# Patient Record
Sex: Female | Born: 1990 | Race: White | Hispanic: No | Marital: Single
Health system: Southern US, Community
[De-identification: ages and names within clinical notes are randomized; demographics above are authoritative.]

---
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
## 2009-02-11 ENCOUNTER — Ambulatory Visit (HOSPITAL_COMMUNITY): Admission: RE | Admit: 2009-02-11 | Discharge: 2009-02-11 | Payer: Self-pay | Admitting: Obstetrics & Gynecology

## 2009-06-18 ENCOUNTER — Ambulatory Visit (HOSPITAL_COMMUNITY): Admission: RE | Admit: 2009-06-18 | Discharge: 2009-06-18 | Payer: Self-pay | Admitting: Obstetrics & Gynecology

## 2009-07-07 ENCOUNTER — Ambulatory Visit: Payer: Self-pay | Admitting: Advanced Practice Midwife

## 2009-07-07 ENCOUNTER — Inpatient Hospital Stay (HOSPITAL_COMMUNITY): Admission: AD | Admit: 2009-07-07 | Discharge: 2009-07-09 | Payer: Self-pay | Admitting: Obstetrics & Gynecology

## 2009-08-15 ENCOUNTER — Emergency Department (HOSPITAL_COMMUNITY): Admission: EM | Admit: 2009-08-15 | Discharge: 2009-08-15 | Payer: Self-pay | Admitting: Emergency Medicine

## 2010-01-11 IMAGING — CR DG CERVICAL SPINE COMPLETE 4+V
6 series · 6 of 6 positions shown · non-contrast
Comparison: None

CLINICAL DATA: Neck trauma and posterior neck pain.

CERVICAL SPINE - 4+ VIEWS

[view not recorded (1 of 6)]
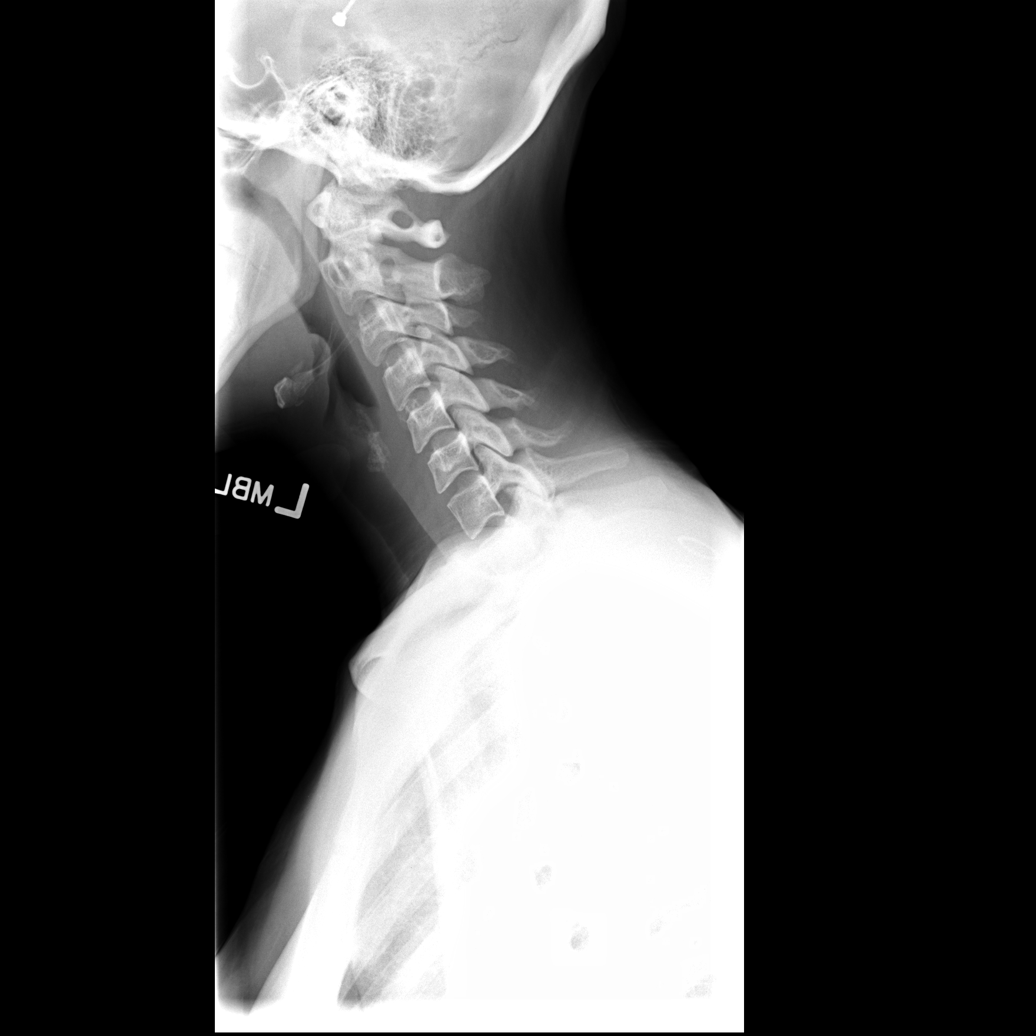

[view not recorded (2 of 6)]
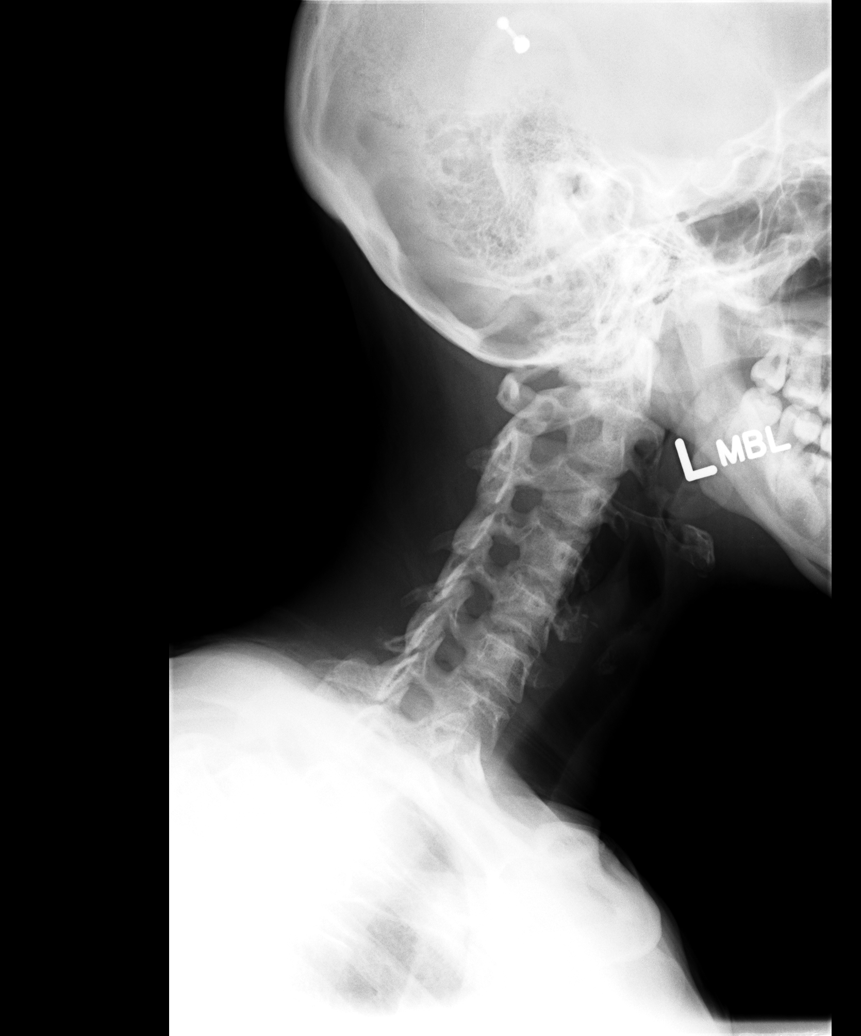

[view not recorded (3 of 6)]
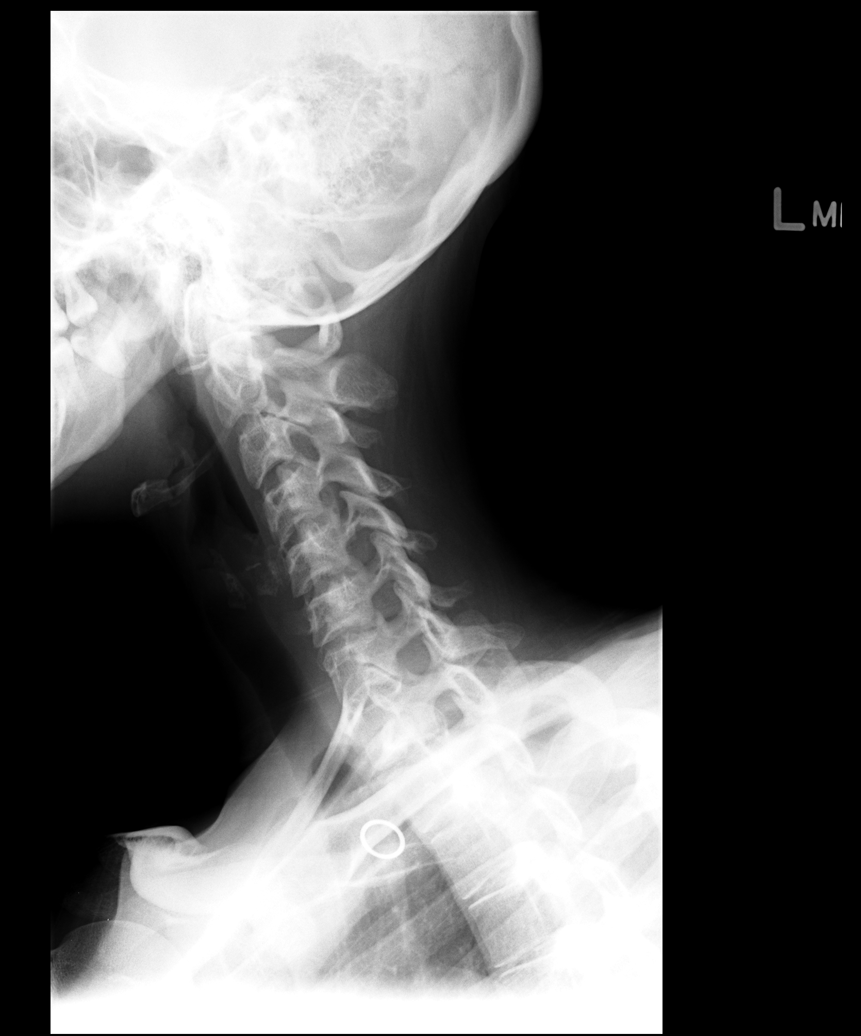

[view not recorded (4 of 6)]
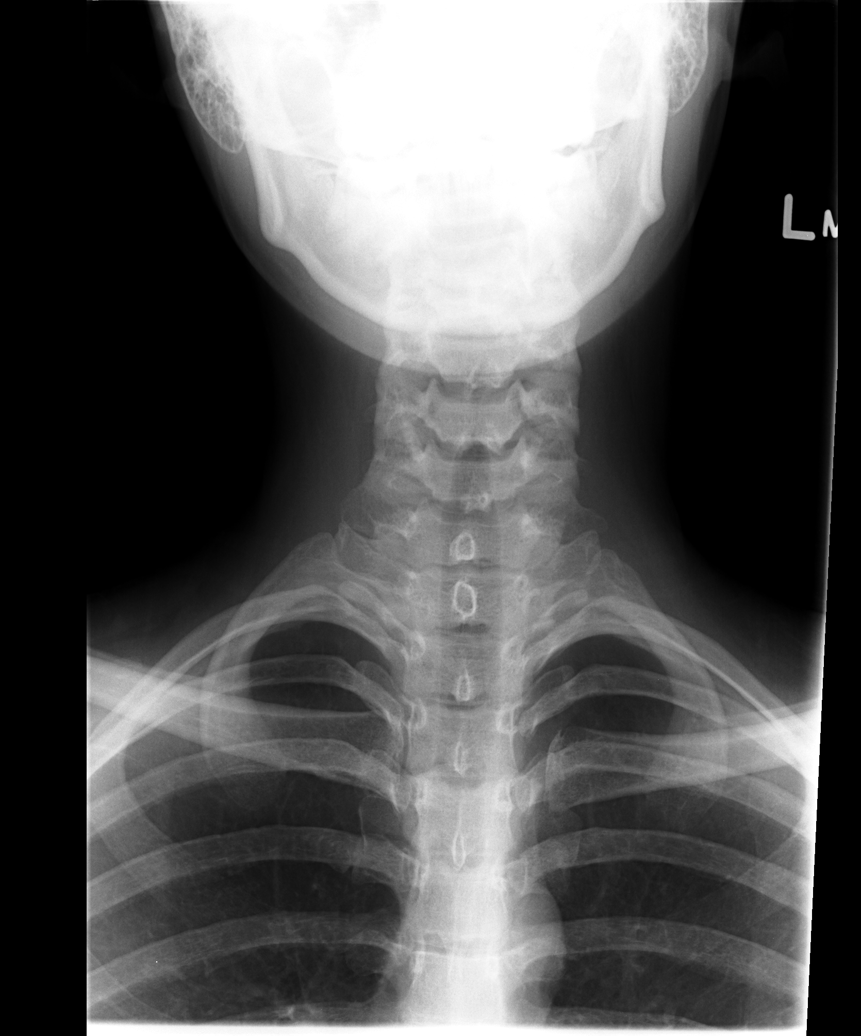

[view not recorded (5 of 6)]
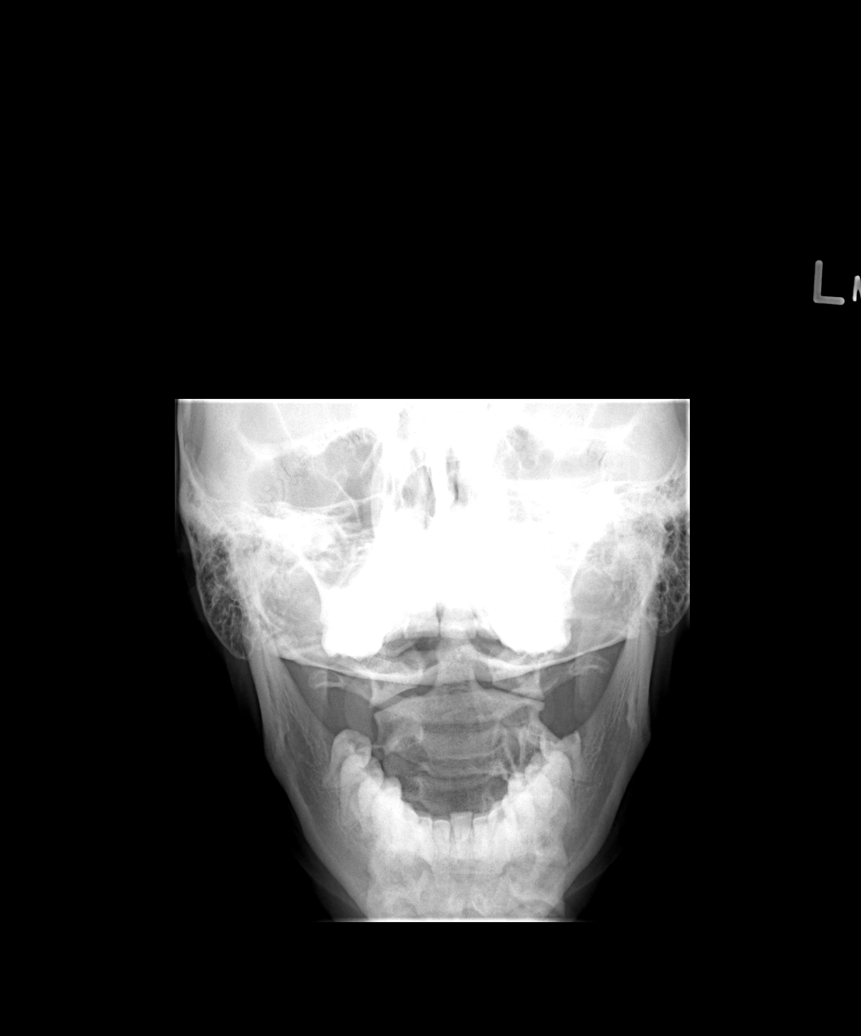

[view not recorded (6 of 6)]
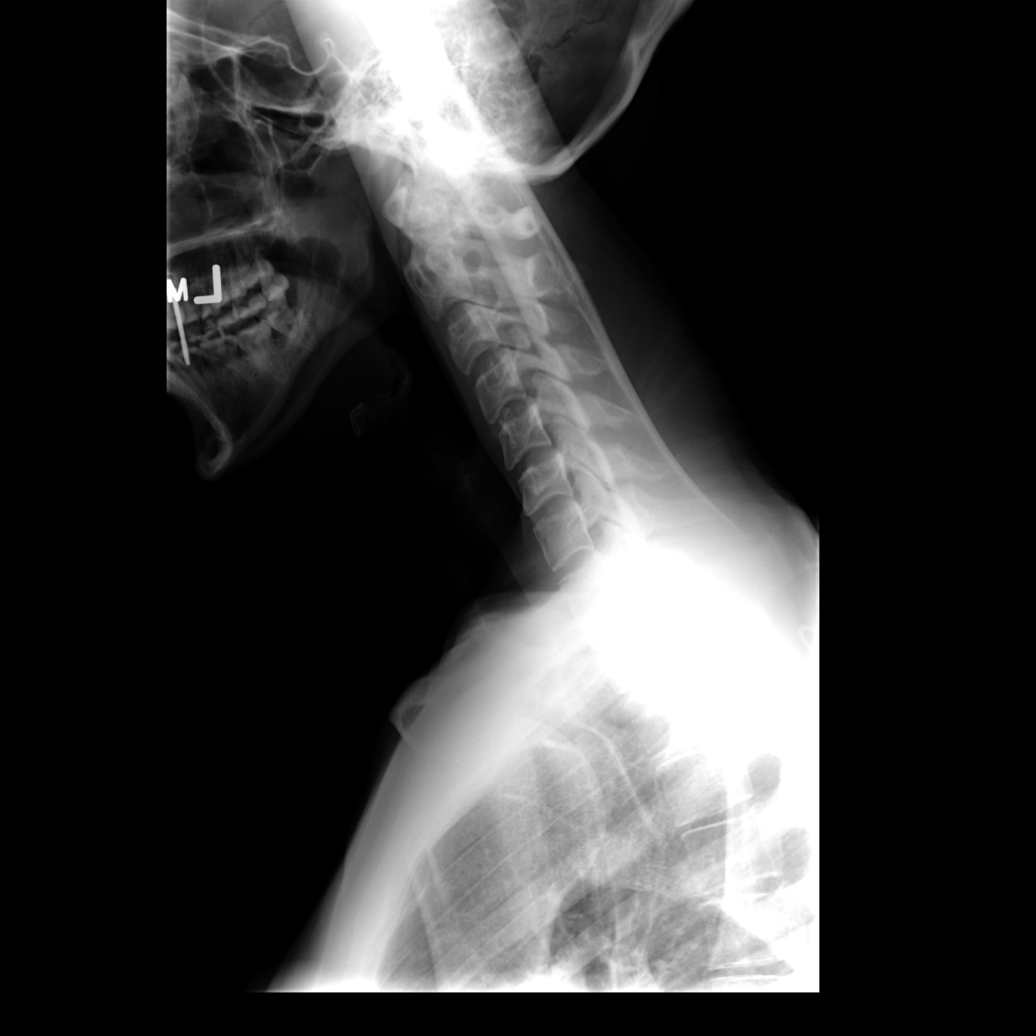

[6 of 6 positions shown; findings below may reference images not displayed]

FINDINGS: There is no evidence of cervical spine fracture or
prevertebral soft tissue swelling.  Alignment is normal.  No other
significant bone abnormalities are identified.
IMPRESSION: Negative cervical spine radiographs.

## 2010-10-04 ENCOUNTER — Emergency Department (HOSPITAL_COMMUNITY)
Admission: EM | Admit: 2010-10-04 | Discharge: 2010-10-04 | Disposition: A | Discharge status: Other Acute Inpatient Hospital | Payer: Self-pay | Source: Home / Self Care | Admitting: Emergency Medicine

## 2010-10-06 LAB — CBC
HCT: 42.2 % (ref 36.0–46.0)
Hemoglobin: 14.5 g/dL (ref 12.0–15.0)
MCH: 30.3 pg (ref 26.0–34.0)
MCHC: 34.4 g/dL (ref 30.0–36.0)
MCV: 88.3 fL (ref 78.0–100.0)
Platelets: 299 10*3/uL (ref 150–400)
RBC: 4.78 MIL/uL (ref 3.87–5.11)
RDW: 13.6 % (ref 11.5–15.5)
WBC: 5.4 10*3/uL (ref 4.0–10.5)

## 2010-10-06 LAB — COMPREHENSIVE METABOLIC PANEL
ALT: 15 U/L (ref 0–35)
AST: 18 U/L (ref 0–37)
Albumin: 4.2 g/dL (ref 3.5–5.2)
Alkaline Phosphatase: 38 U/L — ABNORMAL LOW (ref 39–117)
BUN: 3 mg/dL — ABNORMAL LOW (ref 6–23)
CO2: 28 mEq/L (ref 19–32)
Calcium: 9.6 mg/dL (ref 8.4–10.5)
Chloride: 103 mEq/L (ref 96–112)
Creatinine, Ser: 0.66 mg/dL (ref 0.4–1.2)
GFR calc Af Amer: >60 mL/min (ref >60)
GFR calc non Af Amer: >60 mL/min (ref >60)
Glucose, Bld: 106 mg/dL — ABNORMAL HIGH (ref 70–99)
Potassium: 3.5 mEq/L (ref 3.5–5.1)
Sodium: 140 mEq/L (ref 135–145)
Total Bilirubin: 0.3 mg/dL (ref 0.3–1.2)
Total Protein: 6.9 g/dL (ref 6.0–8.3)

## 2010-10-06 LAB — DIFFERENTIAL
Basophils Absolute: 0 10*3/uL (ref 0.0–0.1)
Basophils Relative: 1 % (ref 0–1)
Eosinophils Absolute: 0.1 10*3/uL (ref 0.0–0.7)
Eosinophils Relative: 1 % (ref 0–5)
Lymphocytes Relative: 32 % (ref 12–46)
Lymphs Abs: 1.7 10*3/uL (ref 0.7–4.0)
Monocytes Absolute: 0.4 10*3/uL (ref 0.1–1.0)
Monocytes Relative: 8 % (ref 3–12)
Neutro Abs: 3.1 10*3/uL (ref 1.7–7.7)
Neutrophils Relative %: 59 % (ref 43–77)

## 2010-10-06 LAB — RAPID URINE DRUG SCREEN, HOSP PERFORMED
Amphetamines: NOT DETECTED
Barbiturates: NOT DETECTED
Benzodiazepines: NOT DETECTED
Cocaine: POSITIVE — AB
Opiates: NOT DETECTED
Tetrahydrocannabinol: NOT DETECTED

## 2010-10-06 LAB — ETHANOL

## 2010-10-06 LAB — ACETAMINOPHEN LEVEL

## 2010-10-06 LAB — POCT PREGNANCY, URINE: Preg Test, Ur: NEGATIVE

## 2010-12-23 LAB — RAPID URINE DRUG SCREEN, HOSP PERFORMED
Amphetamines: NOT DETECTED
Barbiturates: NOT DETECTED
Benzodiazepines: NOT DETECTED
Cocaine: POSITIVE — AB
Opiates: NOT DETECTED
Tetrahydrocannabinol: POSITIVE — AB

## 2010-12-23 LAB — CBC
HCT: 33 % — ABNORMAL LOW (ref 36.0–46.0)
HCT: 45.7 % (ref 36.0–46.0)
Hemoglobin: 11 g/dL — ABNORMAL LOW (ref 12.0–15.0)
Hemoglobin: 15.1 g/dL — ABNORMAL HIGH (ref 12.0–15.0)
MCHC: 33.1 g/dL (ref 30.0–36.0)
MCHC: 33.5 g/dL (ref 30.0–36.0)
MCV: 93.1 fL (ref 78.0–100.0)
MCV: 93.1 fL (ref 78.0–100.0)
Platelets: 167 10*3/uL (ref 150–400)
Platelets: 262 10*3/uL (ref 150–400)
RBC: 3.54 MIL/uL — ABNORMAL LOW (ref 3.87–5.11)
RBC: 4.91 MIL/uL (ref 3.87–5.11)
RDW: 13.6 % (ref 11.5–15.5)
RDW: 14 % (ref 11.5–15.5)
WBC: 11.2 10*3/uL — ABNORMAL HIGH (ref 4.0–10.5)
WBC: 11.8 10*3/uL — ABNORMAL HIGH (ref 4.0–10.5)

## 2010-12-23 LAB — RPR: RPR Ser Ql: NONREACTIVE

## 2011-02-04 NOTE — Assessment & Plan Note (Signed)
[facility]                           [place] OFFICE NOTE   NAME:[name], [name]                           MRN:          [id number]  DATE:08/30/2006                            DOB:          09/03/1991    CHIEF COMPLAINT:  [age]-year-old white female here to establish new  doctor.   HISTORY OF PRESENT ILLNESS:  [name] [name] states she moved here with her dad  from [place].  She reports that she underwent an abortion on July 04, 2006.  She had what she called a vaginal vacuum and was given  antibiotics at an abortion clinic.  Following that, she was begun on  Depo-Provera.  She states that ever since the abortion she has had  continuous bleeding.  She has had bleeding from spotting to heavy with  clots.  It did stop for about a week, but then restarted a few weeks  ago.  She now uses 5 tampons daily that are soaked through.  She denies  any dizziness, fatigue.  She does have occasional chest pain when she is  upset, and occasional palpitations when she is anxious.   She also reports depressed mood over the past year.  She is having  problems with her boyfriend and his ex-girlfriend.  She feels that she  is under an exorbitant amount of stress.  She has crying spells  frequently.  She denies problems with energy, slowed thinking or  insomnia.  She does state that she feels like she needs to gain weight,  given her BMI is 17.  She denies any history of eating disorders,  vomiting or laxative use.  She denies any poor self-esteem or poor self-  image, but states that she knows she definitely needs to gain weight.  She does have a history of self-mutilation 2 to 3 months ago.  She  states she did this with cutting her legs with a knife.  She then  realized that she really did not need to hurt herself when she was upset  because it was not her fault.  She denies suicidal and homicidal  ideation.  She did see a therapist in the past at age 46 after the  divorce, but she has never been on any medications or seen a  psychiatrist, or hospitalized for psychiatric illness before.   REVIEW OF SYSTEMS:  Otherwise negative.   PAST MEDICAL HISTORY:  1. Allergic rhinitis.  2. Tobacco abuse.   HOSPITALIZATIONS, SURGERIES, PROCEDURES:  1. October 2007 abortion.  2. Pap smear 2006, negative.   ALLERGIES:  No known drug allergies.   MEDICATIONS:  Depo-Provera q. 3 months.   FAMILY HISTORY:  Father alive at age 3, healthy.  Mother alive at age  74 with depression and anxiety.  She has one brother who is healthy.  She has a paternal grandmother who had breast cancer at age 65.  She has  a paternal relative who had lung cancer.   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  She is single but is in  a relationship.  She is  sexually active and does not use condoms.  She has no children.  She  does not get regular exercise.  She eats 3 meals a day with snacks, but  does not get regular fruits and vegetables.   PHYSICAL EXAMINATION:  VITAL SIGNS:  Height 66-1/2 inches, weight 108.8,  BMI 17, blood pressure 110/68, pulse 60, temperature 98.4.  GENERAL:  Thin-appearing female in no apparent distress.  HEENT:  PERRLA.  Extraocular muscles are intact.  Oropharynx is clear.  Tympanic membranes are clear.  Nares are clear.  No thyromegaly, no  lymphadenopathy.  Conjunctivae erythematous and not pale.  PSYCHIATRIC:  Appropriate affect, good insight, no suicidal or homicidal  ideation.  CARDIOVASCULAR:  Regular rate and rhythm, no murmurs, rubs or gallops.  Normal PMI, 2+ peripheral pulses.  LUNGS:  Clear to auscultation bilaterally, no wheezes, rales or rhonchi.  ABDOMEN:  Soft, nontender, normoactive bowel sounds, no  hepatosplenomegaly.  MUSCULOSKELETAL:  Strength 5/5 in upper and lower extremities.  NEUROLOGIC:  Cranial nerves II-XII grossly intact.   ASSESSMENT AND PLAN:  1. Menorrhagia:  This may  be secondary to Depo versus retained      products from her abortion in October.  She is due for a Pap smear      and an STD screen, and we will have a genitourinary exam at that      point in time.  Between now and then I will check a pelvic      ultrasound to determine if there are any retained products.  Will      check Hg with blood draw at next visit.  2. Malnutrition: Discussed healthy eating habits.  Concern for eating      disorder.  Rec Nutrition referral and pt agreeable.  3. Mild depression/anxiety: Recommended therapist.  No meds at this      time.  4. Prevention: Due for pap. See above.  Due for tetnus. Obtain old      records. Refused flu vaccine.     Kerby Nora, MD  Electronically Signed    AB/MedQ  DD: 09/01/2006  DT: 09/02/2006  Job #: [id number]

## 2011-02-21 ENCOUNTER — Emergency Department (HOSPITAL_COMMUNITY)
Admission: EM | Admit: 2011-02-21 | Discharge: 2011-02-21 | Disposition: A | Discharge status: Home / Self Care | Payer: Medicaid Other | Attending: Emergency Medicine | Admitting: Emergency Medicine

## 2011-02-21 DIAGNOSIS — F341 Dysthymic disorder: Secondary | ICD-10-CM | POA: Insufficient documentation

## 2011-02-21 DIAGNOSIS — Z79899 Other long term (current) drug therapy: Secondary | ICD-10-CM | POA: Insufficient documentation

## 2011-02-21 DIAGNOSIS — M542 Cervicalgia: Secondary | ICD-10-CM | POA: Insufficient documentation

## 2011-02-21 DIAGNOSIS — R509 Fever, unspecified: Secondary | ICD-10-CM | POA: Insufficient documentation

## 2011-02-21 DIAGNOSIS — R1310 Dysphagia, unspecified: Secondary | ICD-10-CM | POA: Insufficient documentation

## 2011-02-21 DIAGNOSIS — R000 Tachycardia, unspecified: Secondary | ICD-10-CM | POA: Insufficient documentation

## 2011-02-21 DIAGNOSIS — J029 Acute pharyngitis, unspecified: Secondary | ICD-10-CM | POA: Insufficient documentation

## 2011-02-21 LAB — MONONUCLEOSIS SCREEN: Mono Screen: NEGATIVE

## 2011-02-21 LAB — RAPID STREP SCREEN (MED CTR MEBANE ONLY): Streptococcus, Group A Screen (Direct): NEGATIVE

## 2011-04-24 ENCOUNTER — Inpatient Hospital Stay (INDEPENDENT_AMBULATORY_CARE_PROVIDER_SITE_OTHER)
Admission: RE | Admit: 2011-04-24 | Discharge: 2011-04-24 | Disposition: A | Discharge status: Home / Self Care | Payer: Self-pay | Source: Ambulatory Visit | Attending: Family Medicine | Admitting: Family Medicine

## 2011-04-24 DIAGNOSIS — L0390 Cellulitis, unspecified: Secondary | ICD-10-CM

## 2011-04-24 DIAGNOSIS — L0291 Cutaneous abscess, unspecified: Secondary | ICD-10-CM

## 2012-10-03 ENCOUNTER — Emergency Department: Payer: Self-pay | Admitting: Unknown Physician Specialty

## 2012-10-08 LAB — WOUND CULTURE

## 2014-09-30 ENCOUNTER — Emergency Department: Payer: Self-pay | Admitting: Emergency Medicine

## 2014-10-01 LAB — URINALYSIS, COMPLETE
Bilirubin,UR: NEGATIVE
Blood: NEGATIVE
Glucose,UR: NEGATIVE mg/dL (ref 0–75)
Hyaline Cast: 2
Nitrite: NEGATIVE
Ph: 5 (ref 4.5–8.0)
Protein: NEGATIVE
RBC,UR: 2 /HPF (ref 0–5)
Specific Gravity: 1.024 (ref 1.003–1.030)
Squamous Epithelial: 4
WBC UR: 7 /HPF (ref 0–5)

## 2014-10-01 LAB — COMPREHENSIVE METABOLIC PANEL
Albumin: 3.8 g/dL (ref 3.4–5.0)
Alkaline Phosphatase: 51 U/L
Anion Gap: 9 (ref 7–16)
BUN: 13 mg/dL (ref 7–18)
Bilirubin,Total: 0.5 mg/dL (ref 0.2–1.0)
Calcium, Total: 8.6 mg/dL (ref 8.5–10.1)
Chloride: 106 mmol/L (ref 98–107)
Co2: 26 mmol/L (ref 21–32)
Creatinine: 0.8 mg/dL (ref 0.60–1.30)
EGFR (African American): >60
EGFR (Non-African Amer.): >60
Glucose: 92 mg/dL (ref 65–99)
Osmolality: 281 (ref 275–301)
Potassium: 3.6 mmol/L (ref 3.5–5.1)
SGOT(AST): 27 U/L (ref 15–37)
SGPT (ALT): 54 U/L
Sodium: 141 mmol/L (ref 136–145)
Total Protein: 7.1 g/dL (ref 6.4–8.2)

## 2014-10-01 LAB — CBC
HCT: 34.7 % — ABNORMAL LOW (ref 35.0–47.0)
HGB: 11 g/dL — ABNORMAL LOW (ref 12.0–16.0)
MCH: 24.5 pg — ABNORMAL LOW (ref 26.0–34.0)
MCHC: 31.5 g/dL — ABNORMAL LOW (ref 32.0–36.0)
MCV: 78 fL — ABNORMAL LOW (ref 80–100)
Platelet: 382 10*3/uL (ref 150–440)
RBC: 4.47 10*6/uL (ref 3.80–5.20)
RDW: 18.8 % — ABNORMAL HIGH (ref 11.5–14.5)
WBC: 5.9 10*3/uL (ref 3.6–11.0)

## 2014-10-01 LAB — LIPASE, BLOOD: Lipase: 56 U/L — ABNORMAL LOW (ref 73–393)

## 2014-12-02 ENCOUNTER — Observation Stay: Payer: Self-pay | Admitting: Surgery

## 2015-01-12 LAB — SURGICAL PATHOLOGY

## 2015-01-18 NOTE — H&P (Signed)
Subjective/Chief Complaint RUQ pain   History of Present Illness 24 hrs pain, points to RUQ nausea, single emesis had similar episode in January no f/c no hemetemesis   Past History PMH tobacco and crack cocaine abuse PSH none  denies taking any medications   Past Med/Surgical Hx:  Multi-drug Resistant Organism (MDRO): Positive culture for MRSA.  Denies medical/surgical history:   ALLERGIES:  No Known Allergies:   Family and Social History:  Family History Non-Contributory   Social History positive  tobacco, positive ETOH, positive Illicit drugs, crack coc   + Tobacco Current (within 1 year)   Place of Living incarcerated inmate   Review of Systems:  Fever/Chills No   Cough No   Abdominal Pain Yes   Diarrhea No   Constipation No   Nausea/Vomiting Yes   SOB/DOE No   Chest Pain No   Dysuria No   Tolerating Diet No  Nauseated  Vomiting   Medications/Allergies Reviewed Medications/Allergies reviewed   Physical Exam:  GEN no acute distress   HEENT pink conjunctivae   NECK supple   RESP normal resp effort  clear BS   CARD regular rate   ABD positive tenderness  no hernia  rigid  tender both LQ, tense, guarding, perc tenderness, min rebound   LYMPH negative neck   EXTR negative edema   SKIN normal to palpation, mult tattoos   PSYCH alert, A+O to time, place, person, good insight   Lab Results: Hepatic:  14-Mar-16 19:22   Bilirubin, Total 0.6 (0.3-1.2 NOTE: New Reference Range  11/11/14)  Alkaline Phosphatase 47 (38-126 NOTE: New Reference Range  11/11/14)  SGPT (ALT) 27 (14-54 NOTE: New Reference Range  11/11/14)  SGOT (AST) 18 (15-41 NOTE: New Reference Range  11/11/14)  Total Protein, Serum 7.8 (6.5-8.1 NOTE: New Reference Range  11/11/14)  Albumin, Serum 4.3 (3.5-5.0 NOTE: New reference range  11/11/14)  Routine Chem:  14-Mar-16 19:22   Glucose, Serum  121 (65-99 NOTE: New Reference Range  11/11/14)  BUN 14  (6-20 NOTE: New Reference Range  11/11/14)  Creatinine (comp) 0.73 (0.44-1.00 NOTE: New Reference Range  11/11/14)  Sodium, Serum  134 (135-145 NOTE: New Reference Range  11/11/14)  Potassium, Serum 3.8 (3.5-5.1 NOTE: New Reference Range  11/11/14)  Chloride, Serum  99 (101-111 NOTE: New Reference Range  11/11/14)  CO2, Serum 26 (22-32 NOTE: New Reference Range  11/11/14)  Calcium (Total), Serum  8.8 (8.9-10.3 NOTE: New Reference Range  11/11/14)  eGFR (African American) >60  eGFR (Non-African American) >60 (eGFR values <84mL/min/1.73 m2 may be an indication of chronic kidney disease (CKD). Calculated eGFR is useful in patients with stable renal function. The eGFR calculation will not be reliable in acutely ill patients when serum creatinine is changing rapidly. It is not useful in patients on dialysis. The eGFR calculation may not be applicable to patients at the low and high extremes of body sizes, pregnant women, and vegetarians.)  Anion Gap 9  Lipase 45 (22-51 NOTE: New Reference Range  11/11/14)  Routine UA:  14-Mar-16 23:00   Color (UA) Yellow  Clarity (UA) Clear  Glucose (UA) Negative  Bilirubin (UA) Negative  Ketones (UA) 2+  Specific Gravity (UA) 1.016  Blood (UA) 1+  pH (UA) 6.0  Protein (UA) Negative  Nitrite (UA) Negative  Leukocyte Esterase (UA) Trace (Result(s) reported on 02 Dec 2014 at 12:06AM.)  RBC (UA) 4 /HPF  WBC (UA) 5 /HPF  Bacteria (UA) NONE SEEN  Epithelial Cells (UA) 4 /HPF  Mucous (UA) PRESENT (Result(s) reported on 02 Dec 2014 at 12:06AM.)  Routine Sero:  14-Mar-16 23:00   Pregnancy Test, Urine NEGATIVE (The results of the qualitative urine HCG (Pregnancy Test) should be evaluated in light of other clinical information.  There are limitations to the test which, in certain clinical situations, may result in a false positive or negative result. Thehigh dose hook effect can occur in urine samples with extremely high HCG  concentrations.  This effect can produce a negative result in certain situations. It is suggested that results of the qualitative HCG be confirmed by an alternate methodology, such as the quantitative serum beta HCG test.)  Routine Hem:  14-Mar-16 19:22   WBC (CBC)  14.6  RBC (CBC) 5.15  Hemoglobin (CBC) 12.9  Hematocrit (CBC) 39.8  Platelet Count (CBC) 246  MCV  77  MCH  25.1  MCHC 32.5  RDW  22.1  Neutrophil % 85.1  Lymphocyte % 8.3  Monocyte % 5.5  Eosinophil % 0.3  Basophil % 0.8  Neutrophil #  12.4  Lymphocyte # 1.2  Monocyte # 0.8  Eosinophil # 0.0  Basophil # 0.1 (Result(s) reported on 01 Dec 2014 at 07:47PM.)   Radiology Results: [place]:    14-Mar-16 23:51, US Abdomen Limited Survey  US Abdomen Limited Survey  REASON FOR EXAM:    ruq pain fever  COMMENTS:   Body Site: Gallbladder, Liver, Common Bile Duct    PROCEDURE: [place]  - US ABDOMEN LIMITED SURVEY  - Dec 01 2014 11:51PM     CLINICAL DATA:  Right upper quadrant pain.  Fever.    EXAM:  US ABDOMEN LIMITED -RIGHT UPPER QUADRANT    COMPARISON:  10/01/2014    FINDINGS:  Gallbladder:  No gallstones or wall thickening visualized. No sonographic Murphy  sign noted. Small amount of intraluminal sludge.    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 No other focal liver lesions are evident. Remainder of  the liver is normal in echotexture.     IMPRESSION:  Small presumed hepatic hemangioma. Small volume intraluminal  gallbladder sludge.      Electronically Signed    By: Ellery Plunk M.D.    On: 12/02/2014 00:23         Verified By: Ellery Plunk, M.D.,  CT:    15-Mar-16 02:10, CT Abdomen and Pelvis With Contrast  CT Abdomen and Pelvis With Contrast  REASON FOR EXAM:    (1) RUQ/RLQ pain fever wbc 14.6; (2) RUQ/RLQ pain    fever wbc 14.6  COMMENTS:       PROCEDURE: CT  - CT ABDOMEN / PELVIS  W  - Dec 02 2014  2:10AM     CLINICAL DATA:  Right upper quadrant pain for 2 days    EXAM:  CT ABDOMEN AND PELVIS WITH CONTRAST    TECHNIQUE:  Multidetector CT imaging of the abdomen and pelvis was performed  using the standard protocol following bolus administration of  intravenous contrast.  CONTRAST:  100 mL Omnipaque 300 intravenous    COMPARISON:  Ultrasound 12/01/2014    FINDINGS:  There is acute inflammatory change in the low abdominal midline. I  believe the inflammation surrounds a rather poorly defined enlarged  appendix. No abscess is evident. There is no extraluminal air. There  is no bowel obstruction. There is a small volume ascites collected  dependently in the pelvis.    There are normal appearances of the liver, spleen, pancreas,  adrenals and kidneys. Uterus and ovaries appear unremarkable. No  significant abnormality is evident inthe lower chest. No  significant musculoskeletal abnormality is evident.   IMPRESSION:  Acute inflammatory changes in the low midline, most likely acute  appendicitis.    Critical Value/emergent results were called by telephone at the time  of interpretation on 12/02/2014 at 2:53 am to Dr. Marjean Donna ,  who verbally acknowledged these results.      Electronically Signed    By: Andreas Newport M.D.    On: 12/02/2014 02:53         Verified By: Andreas Newport, M.D.,    Assessment/Admission Diagnosis acute appendicitis admit, lap appy see dictation   Electronic Signatures: Florene Glen (MD)  (Signed 15-Mar-16 03:26)  Authored: CHIEF COMPLAINT and HISTORY, PAST MEDICAL/SURGIAL HISTORY, ALLERGIES, FAMILY AND SOCIAL HISTORY, REVIEW OF SYSTEMS, PHYSICAL EXAM, LABS, Radiology, ASSESSMENT AND PLAN   Last Updated: 15-Mar-16 03:26 by Florene Glen (MD)

## 2015-01-18 NOTE — Op Note (Signed)
PATIENT NAME:  [name] GasserMOUNCE, [name] MR#:  [id number] DATE OF BIRTH:  Sep 21, 1990  DATE OF PROCEDURE:  12/02/2014  PREOPERATIVE DIAGNOSIS: Acute appendicitis.   POSTOPERATIVE DIAGNOSIS: Acute appendicitis.    PROCEDURE:   Laparoscopic appendectomy.    SURGEON: Raynald KempMark A Shantoria Ellwood, MD   ASSISTANT: None.   ANESTHESIA: General endotracheal.   FINDINGS: Acute appendicitis.   SPECIMENS: Appendix to pathology.   ESTIMATED BLOOD LOSS: 25 mL.   DRAINS: None.   COUNT:   LAP and needle count correct x2.   DESCRIPTION OF PROCEDURE: With informed consent obtained from the patient, she was brought to the Operating Room and positioned supine. General endotracheal anesthesia was induced. The patient's abdomen was widely prepped and draped utilizing ChloraPrep solution. Timeout was observed. A 12 mm blunt Hassan trocar was placed under direct visualization through a transverse oriented skin incision below the umbilicus with stay sutures being passed through the fascia. Pneumoperitoneum was established. The patient was then positioned in Trendelenburg. A 5 mm bladeless trocar was placed in the right upper quadrant.   An additional left lower quadrant 5 mm bladeless trocar was placed under direct visualization. The appendix was dissected off the right pelvic sidewall with blunt technique. The cecum was essentially unattached to the right lateral gutter and was in the pelvis. The appendix was liberated out of the pelvis along all along with the cecum. The course of the terminal ileum was identified. The appendix was identified with the base at the confluence of the tinea.  To aid in dissection, an additional 5 mm trocar was placed in the left lower quadrant above the previously placed trocar. Through port, the dissection of the mesoappendix and division of the mesoappendiceal arteries with the Harmonic scalpel apparatus was achieved. With hemostasis being ensured and the base of the appendix clearly identified, the base of the  appendix was transected with a single fire of the endoscopic stapler with blue load application. The specimen was captured in an Endo Catch device and retrieved. Pneumoperitoneum and ports were then replaced. The patient's right lower quadrant and pelvis were irrigated with a total of 1 liter of warm normal saline and aspirated dry and hemostasis was ensured on the operative field. Ports were then removed.   The infraumbilical fascial defect was closed with an additional figure-of-eight #0 Vicryl suture placed in vertical orientation, the existing stay sutures tied to each other. A total of 28 mL of 0.25% plain Marcaine was infiltrated along all skin and fascial incisions prior to closure. Vicryl 4-0 subcuticular was applied to all skin edges. Steri-Strips, benzoin, Telfa and Tegaderm were then applied and the patient was subsequently extubated and taken to the recovery room in stable and satisfactory condition by anesthesia services.     ____________________________ Redge GainerMark A. Egbert GaribaldiBird, MD mab:tr D: 12/02/2014 11:51:58 ET T: 12/02/2014 12:14:55 ET JOB#: [id number]  cc: Loraine LericheMark A. Egbert GaribaldiBird, MD, <Dictator> Raynald KempMARK A Lamari Beckles MD ELECTRONICALLY SIGNED 12/07/2014 19:01

## 2015-01-18 NOTE — Discharge Summary (Signed)
PATIENT NAME:  [name] [name], [name] [name] MR#:  [id number] DATE OF BIRTH:  [birth year]  DATE OF ADMISSION:  12/02/2014 DATE OF DISCHARGE:  12/04/2014  FINAL DIAGNOSIS: Acute appendicitis  PROCEDURES:   1.  CT scan of the abdomen and pelvis.  2.  Laparoscopic appendectomy.   HOSPITAL COURSE SUMMARY:  The patient was admitted with leukocytosis, abdominal pain, and acute appendicitis. Laparoscopic appendectomy was performed following a brief stay in the hospital. The patient had a significantly inflamed appendix. There was some purulent fluid seen within the pelvis. The patient was treated as if she was perforated. The patient was a resident of a local jail. She was accompanied by security detail the entire time of her hospitalization.   She had adequate pain control, was tolerating a regular diet, adequate incisional pain, was ambulatory with the assistance of her guard detail. She was stable for discharge on postoperative day #2.   DISCHARGE MEDICATIONS: Augmentin 500 mg tablet 1 tablet every 8 hours for 7 more days,  DISCHARGE INSTRUCTIONS:  Followup in my office in 7 to 10 days' time.  The jail was instructed to call with any questions or concerns.    ____________________________ Redge GainerMark A. Egbert GaribaldiBird, MD mab:sp D: 12/11/2014 09:04:45 ET T: 12/11/2014 14:50:25 ET JOB#: [id number]  cc: Loraine LericheMark A. Egbert GaribaldiBird, MD, <Dictator> Raynald KempMARK A Bennie Scaff MD ELECTRONICALLY SIGNED 12/15/2014 23:16

## 2015-01-18 NOTE — H&P (Signed)
PATIENT NAME:  [name] [name], [name] MR#:  [id number] DATE OF BIRTH:  Mar 13, 1991  DATE OF ADMISSION:  12/01/2014  CHIEF COMPLAINT:  Right upper quadrant pain.   HISTORY OF PRESENT ILLNESS:  This is a patient with right upper quadrant pain that started yesterday. She has had nausea and a single emesis. She has had normal bowel movements, questionable fevers. She is an incarcerated [place] and [place] [facility] She states she has had this once before back in January and had a negative ultrasound. She continues to point to the right upper quadrant as the source of her pain; however, a workup has suggested appendicitis, and Dr. Manson PasseyBrown and I discussed her condition and disposition. She denies melena or hematochezia. No hematemesis.   PAST MEDICAL HISTORY:  None. Denies taking any medications at this time.   PAST SURGICAL HISTORY:  None.   ALLERGIES:  None.   MEDICATIONS:  She denies taking any medications at this time, but there are medications listed in the reconciliation that, I believe, she no longer takes.   FAMILY HISTORY:  No history of colon cancer.   SOCIAL HISTORY:  The patient is an incarcerated inmate and has done crack cocaine, smoking, and alcohol abuse.   REVIEW OF SYSTEMS:  A complete system review is performed and negative with the exception of that mentioned in the HPI.   PHYSICAL EXAMINATION: VITAL SIGNS:  Febrile patient with temperature of 102.2 and now 99.5, pulse 110, respirations 16, blood pressure 120/80. Pain scale is 7.  HEENT:  No scleral icterus.  NECK:  No palpable neck nodes.  CHEST:  Clear to auscultation.  CARDIAC:  Regular rate and rhythm.  ABDOMEN:  Slightly distended, but fairly tense. She points to the right upper quadrant, but she is maximally tender in the midline and right lower quadrant with percussion tenderness and questionable rebound tenderness and guarding.  EXTREMITIES:  Without edema.  NEUROLOGIC:  Grossly intact.   INTEGUMENT:  No jaundice, but tattoos are present.   LABORATORY VALUES:  Demonstrate an elevated white blood cell count. CT scan is personally reviewed and suggestive of appendicitis near the midline and right lower quadrant with inflammatory changes. She does have sludge in her gallbladder.   ASSESSMENT AND PLAN:  This patient points to the right upper quadrant with recurrent abdominal pain; this happened back in January, but now she has an elevated white blood cell count and CT findings suggestive of appendicitis. I have recommended admitting to the hospital and starting IV antibiotics. I will ask Dr. Egbert GaribaldiBird to re-examine the patient and consider laparoscopic appendectomy. I did discuss with her the rationale for proceeding in this manner and the likelihood of a diagnostic laparoscopy with laparoscopic appendectomy, and the risks of bleeding, infection, recurrence, negative laparoscopy, and an open procedure were reviewed for her. I notified her as well that Dr. Egbert GaribaldiBird would be performing the surgery if necessary.    ____________________________ Adah Salvageichard E. Excell Seltzerooper, MD rec:nb D: 12/02/2014 03:30:00 ET T: 12/02/2014 03:38:46 ET JOB#: [id number]  cc: Adah Salvageichard E. Excell Seltzerooper, MD, <Dictator> Lattie HawICHARD E Shavana Calder MD ELECTRONICALLY SIGNED 12/02/2014 22:53

## 2019-03-11 ENCOUNTER — Other Ambulatory Visit (HOSPITAL_COMMUNITY): Payer: Self-pay | Admitting: Family Medicine

## 2019-03-16 LAB — GONOCOCCUS CULTURE

## 2019-04-01 ENCOUNTER — Encounter: Payer: Self-pay | Admitting: Physician Assistant

## 2021-05-17 ENCOUNTER — Other Ambulatory Visit: Payer: Self-pay

## 2021-05-17 ENCOUNTER — Ambulatory Visit: Payer: Self-pay

## 2021-05-21 ENCOUNTER — Ambulatory Visit: Payer: Self-pay

## 2021-12-06 ENCOUNTER — Ambulatory Visit: Payer: Medicaid Other

## 2023-01-06 NOTE — Progress Notes (Deleted)
    New patient visit   Patient: [name] [name]   DOB: [birth year]   33 y.o. Female  MRN: [id number] Visit Date: 01/09/2023  Today's healthcare provider: Ronnald Ramp, MD   No chief complaint on file.  Subjective    [name] [name] is a 32 y.o. female who presents today as a new patient to establish care.  HPI  ***  No past medical history on file. *** The histories are not reviewed yet. Please review them in the "History" navigator section and refresh this SmartLink. No family status information on file.   No family history on file. Social History   Socioeconomic History   Marital status: Single    Spouse name: Not on file   Number of children: Not on file   Years of education: Not on file   Highest education level: Not on file  Occupational History   Not on file  Tobacco Use   Smoking status: Not on file   Smokeless tobacco: Not on file  Substance and Sexual Activity   Alcohol use: Not on file   Drug use: Not on file   Sexual activity: Not on file  Other Topics Concern   Not on file  Social History Narrative   Not on file   Social Determinants of Health   Financial Resource Strain: Not on file  Food Insecurity: Not on file  Transportation Needs: Not on file  Physical Activity: Not on file  Stress: Not on file  Social Connections: Not on file   No outpatient medications prior to visit.   No facility-administered medications prior to visit.   Not on File  Immunization History  Administered Date(s) Administered   Hepatitis B 06/14/2000, 07/19/2000, 01/02/2001    Health Maintenance  Topic Date Due   COVID-19 Vaccine (1) Never done   HIV Screening  Never done   Hepatitis C Screening  Never done   DTaP/Tdap/Td (1 - Tdap) Never done   PAP SMEAR-Modifier  Never done   INFLUENZA VACCINE  04/20/2023   HPV VACCINES  Aged Out    Patient Care Team: Pcp, No as PCP - General  Review of Systems  {Labs  Heme  Chem  Endocrine  Serology   Results Review (optional):23779}   Objective    There were no vitals taken for this visit. {Show previous vital signs (optional):23777}  Physical Exam ***  Depression Screen     No data to display         No results found for any visits on 01/09/23.  Assessment & Plan     ***  No follow-ups on file.     {provider attestation***:1}   Ronnald Ramp, MD  [facility] [phone] (phone) [phone] (fax)  [facility]

## 2023-01-09 ENCOUNTER — Ambulatory Visit: Payer: Medicaid Other | Admitting: Family Medicine

## 2023-02-14 ENCOUNTER — Ambulatory Visit: Payer: Medicaid Other | Admitting: Surgery
